# Patient Record
Sex: Male | Born: 1990
Health system: Southern US, Community
[De-identification: ages and names within clinical notes are randomized; demographics above are authoritative.]

---

## 2015-05-05 ENCOUNTER — Emergency Department (HOSPITAL_BASED_OUTPATIENT_CLINIC_OR_DEPARTMENT_OTHER): Payer: BLUE CROSS/BLUE SHIELD

## 2015-05-05 ENCOUNTER — Emergency Department (HOSPITAL_BASED_OUTPATIENT_CLINIC_OR_DEPARTMENT_OTHER)
Admission: EM | Admit: 2015-05-05 | Discharge: 2015-05-05 | Disposition: A | Discharge status: Home / Self Care | Payer: BLUE CROSS/BLUE SHIELD | Attending: Emergency Medicine | Admitting: Emergency Medicine

## 2015-05-05 ENCOUNTER — Encounter (HOSPITAL_BASED_OUTPATIENT_CLINIC_OR_DEPARTMENT_OTHER): Payer: Self-pay | Admitting: Emergency Medicine

## 2015-05-05 DIAGNOSIS — R1032 Left lower quadrant pain: Secondary | ICD-10-CM | POA: Diagnosis present

## 2015-05-05 DIAGNOSIS — K529 Noninfective gastroenteritis and colitis, unspecified: Secondary | ICD-10-CM | POA: Insufficient documentation

## 2015-05-05 LAB — COMPREHENSIVE METABOLIC PANEL
ALT: 75 U/L — AB (ref 17–63)
ANION GAP: 7 (ref 5–15)
AST: 39 U/L (ref 15–41)
Albumin: 4.4 g/dL (ref 3.5–5.0)
Alkaline Phosphatase: 61 U/L (ref 38–126)
BUN: 15 mg/dL (ref 6–20)
CHLORIDE: 106 mmol/L (ref 101–111)
CO2: 25 mmol/L (ref 22–32)
CREATININE: 1.06 mg/dL (ref 0.61–1.24)
Calcium: 9.4 mg/dL (ref 8.9–10.3)
GFR calc non Af Amer: >60 mL/min (ref >60)
Glucose, Bld: 97 mg/dL (ref 65–99)
Potassium: 4 mmol/L (ref 3.5–5.1)
SODIUM: 138 mmol/L (ref 135–145)
Total Bilirubin: 1.1 mg/dL (ref 0.3–1.2)
Total Protein: 7.3 g/dL (ref 6.5–8.1)

## 2015-05-05 LAB — CBC WITH DIFFERENTIAL/PLATELET
Basophils Absolute: 0.1 10*3/uL (ref 0.0–0.1)
Basophils Relative: 1 %
Eosinophils Absolute: 0.1 10*3/uL (ref 0.0–0.7)
Eosinophils Relative: 1 %
HEMATOCRIT: 42.4 % (ref 39.0–52.0)
HEMOGLOBIN: 15.3 g/dL (ref 13.0–17.0)
LYMPHS ABS: 2.3 10*3/uL (ref 0.7–4.0)
Lymphocytes Relative: 25 %
MCH: 31.5 pg (ref 26.0–34.0)
MCHC: 36.1 g/dL — AB (ref 30.0–36.0)
MCV: 87.2 fL (ref 78.0–100.0)
MONOS PCT: 10 %
Monocytes Absolute: 0.9 10*3/uL (ref 0.1–1.0)
NEUTROS ABS: 5.8 10*3/uL (ref 1.7–7.7)
NEUTROS PCT: 63 %
Platelets: 182 10*3/uL (ref 150–400)
RBC: 4.86 MIL/uL (ref 4.22–5.81)
RDW: 12.4 % (ref 11.5–15.5)
WBC: 9.1 10*3/uL (ref 4.0–10.5)

## 2015-05-05 LAB — URINALYSIS, ROUTINE W REFLEX MICROSCOPIC
Bilirubin Urine: NEGATIVE
Glucose, UA: NEGATIVE mg/dL
HGB URINE DIPSTICK: NEGATIVE
Ketones, ur: NEGATIVE mg/dL
LEUKOCYTES UA: NEGATIVE
NITRITE: NEGATIVE
PROTEIN: NEGATIVE mg/dL
Specific Gravity, Urine: 1.021 (ref 1.005–1.030)
pH: 6 (ref 5.0–8.0)

## 2015-05-05 LAB — LIPASE, BLOOD: Lipase: 21 U/L (ref 11–51)

## 2015-05-05 MED ORDER — IOPAMIDOL (ISOVUE-300) INJECTION 61%
100.0000 mL | Freq: Once | INTRAVENOUS | Status: AC | PRN
  Administered 2015-05-05: 100 mL via INTRAVENOUS

## 2015-05-05 MED ORDER — CIPROFLOXACIN HCL 500 MG PO TABS: 500.0000 mg | ORAL_TABLET | Freq: Two times a day (BID) | ORAL | Status: DC

## 2015-05-05 MED ORDER — ONDANSETRON HCL 4 MG/2ML IJ SOLN: 4.0000 mg | Freq: Once | INTRAMUSCULAR | Status: DC

## 2015-05-05 MED ORDER — METRONIDAZOLE 500 MG PO TABS: 500.0000 mg | ORAL_TABLET | Freq: Two times a day (BID) | ORAL | Status: DC

## 2015-05-05 MED ORDER — KETOROLAC TROMETHAMINE 30 MG/ML IJ SOLN
30.0000 mg | Freq: Once | INTRAMUSCULAR | Status: AC
  Administered 2015-05-05: 30 mg via INTRAVENOUS
  Filled 2015-05-05: qty 1

## 2015-05-05 MED ORDER — FENTANYL CITRATE (PF) 100 MCG/2ML IJ SOLN: 100.0000 ug | Freq: Once | INTRAMUSCULAR | Status: DC

## 2015-05-05 MED ORDER — MORPHINE SULFATE 30 MG PO TABS
15.0000 mg | ORAL_TABLET | ORAL | Status: DC | PRN
Start: 2015-05-05 — End: 2020-09-01

## 2015-05-05 MED FILL — MORPHINE SULFATE IR 30 MG T: 30 | 3 days supply | Qty: 20 | Fill #0

## 2015-05-05 MED FILL — metroNIDAZOLE 500 MG TABS: 500 | 7 days supply | Qty: 14 | Fill #0

## 2015-05-05 MED FILL — CIPROFLOXACIN HCL 500 MG TA: 500 | 7 days supply | Qty: 14 | Fill #0

## 2015-05-05 NOTE — ED Provider Notes (Signed)
CSN: [id number]     Arrival date & time 05/05/15  1106 History   First MD Initiated Contact with Patient 05/05/15 1148     Chief Complaint  Patient presents with  . Abdominal Pain     (Consider location/radiation/quality/duration/timing/severity/associated sxs/prior Treatment) HPI   Darrell Sanchez Is a 25 year old male who presents emergency Department with chief complaint of left lower quadrant abdominal pain. The patient states that he has had progressively worsening left lower quadrant abdominal pain over the past 3 days. Patient states that has become intolerable and he came for evaluation this morning. He describes the pain as constant, achy, worse with movement, walking, or hip flexion. He states that he has had associated urgency of urination. He denies hematuria, flank pain. He denies diarrhea or constipation. He has a family history of a mother and grandmother who has a history of kidney stones. He denies a personal history of kidney stones. He denies nausea or vomiting. He has no other contributory past medical history  History reviewed. No pertinent past medical history. History reviewed. No pertinent past surgical history. History reviewed. No pertinent family history. Social History  Substance Use Topics  . Smoking status: Never Smoker   . Smokeless tobacco: None  . Alcohol Use: Yes     Comment: occ    Review of Systems  Ten systems reviewed and are negative for acute change, except as noted in the HPI.    Allergies  Review of patient's allergies indicates no known allergies.  Home Medications   Prior to Admission medications   Not on File   BP 129/70 mmHg  Pulse 78  Temp(Src) 97.3 F (36.3 C) (Oral)  Resp 18  Ht  (1.753 m)  Wt 85.276 kg  BMI 27.75 kg/m2  SpO2 98% Physical Exam  Constitutional: He appears well-developed and well-nourished. No distress.  HENT:  Head: Normocephalic and atraumatic.  Eyes: Conjunctivae are normal. No scleral icterus.    Neck: Normal range of motion. Neck supple.  Cardiovascular: Normal rate, regular rhythm and normal heart sounds.   Pulmonary/Chest: Effort normal and breath sounds normal. No respiratory distress.  Abdominal: Soft. Bowel sounds are normal. There is tenderness in the left lower quadrant. There is guarding. There is no rebound and no CVA tenderness. No hernia.    Musculoskeletal: He exhibits no edema.  Neurological: He is alert.  Skin: Skin is warm and dry. He is not diaphoretic.  Psychiatric: His behavior is normal.  Nursing note and vitals reviewed.   ED Course  Procedures (including critical care time) Labs Review Labs Reviewed  CBC WITH DIFFERENTIAL/PLATELET - Abnormal; Notable for the following:    MCHC 36.1 (*)    All other components within normal limits  URINALYSIS, ROUTINE W REFLEX MICROSCOPIC (NOT AT [facility])  COMPREHENSIVE METABOLIC PANEL  LIPASE, BLOOD    Imaging Review No results found. I have personally reviewed and evaluated these images and lab results as part of my medical decision-making.   EKG Interpretation None      MDM   Final diagnoses:  None    12:33 PM BP 129/70 mmHg  Pulse 78  Temp(Src) 97.3 F (36.3 C) (Oral)  Resp 18  Ht  (1.753 m)  Wt 85.276 kg  BMI 27.75 kg/m2  SpO2 98%  Patient with llq abdominal pain and tenderness. Neg CVA and clean urine.  ? Diverticulitis/ colitis. ddx psoas abscess/dysfunction, ureteral colic, uti, abdominal wall tenderness.  Patient CT scan positive for colitis. I have discussed the  finding with the patient. He will be discharged with Cipro and Flagyl along with pain medication, antinausea medications. Discussed need for clear liquid diet for next 3 days and then slow advancement. Patient is to follow up with gastroenterology for colonoscopy and he understands the plan of care. He appears safe for discharge at this time. Discussed return precautions.   Arthor Captainbigail Rupa Lagan, PA-C 05/05/15 1704  Jerelyn ScottMartha Linker,  MD 05/06/15 [phone]

## 2015-05-05 NOTE — Discharge Instructions (Signed)
Colitis Colitis is inflammation of the colon. Colitis may last a short time (acute) or it may last a long time (chronic). CAUSES This condition may be caused by:  Viruses.  Bacteria.  Reactions to medicine.  Certain autoimmune diseases, such as Crohn disease or ulcerative colitis. SYMPTOMS Symptoms of this condition include:  Diarrhea.  Passing bloody or tarry stool.  Pain.  Fever.  Vomiting.  Tiredness (fatigue).  Weight loss.  Bloating.  Sudden increase in abdominal pain.  Having fewer bowel movements than usual. DIAGNOSIS This condition is diagnosed with a stool test or a blood test. You may also have other tests, including X-rays, a CT scan, or a colonoscopy. TREATMENT Treatment may include:  Resting the bowel. This involves not eating or drinking for a period of time.  Fluids that are given through an IV tube.  Medicine for pain and diarrhea.  Antibiotic medicines.  Cortisone medicines.  Surgery. HOME CARE INSTRUCTIONS Eating and Drinking  Follow instructions from your health care provider about eating or drinking restrictions.  Drink enough fluid to keep your urine clear or pale yellow.  Work with a dietitian to determine which foods cause your condition to flare up.  Avoid foods that cause flare-ups.  Eat a well-balanced diet. Medicines  Take over-the-counter and prescription medicines only as told by your health care provider.  If you were prescribed an antibiotic medicine, take it as told by your health care provider. Do not stop taking the antibiotic even if you start to feel better. General Instructions  Keep all follow-up visits as told by your health care provider. This is important. SEEK MEDICAL CARE IF:  Your symptoms do not go away.  You develop new symptoms. SEEK IMMEDIATE MEDICAL CARE IF:  You have a fever that does not go away with treatment.  You develop chills.  You have extreme weakness, fainting, or  dehydration.  You have repeated vomiting.  You develop severe pain in your abdomen.  You pass bloody or tarry stool.   This information is not intended to replace advice given to you by your health care provider. Make sure you discuss any questions you have with your health care provider.   Document Released: 03/09/2004 Document Revised: 10/21/2014 Document Reviewed: 05/25/2014 Elsevier Interactive Patient Education 2016 Else[employer] Clear Liquid Diet A clear liquid diet is a short-term diet that is prescribed to provide the necessary fluid and basic energy you need when you can have nothing else. The clear liquid diet consists of liquids or solids that will become liquid at room temperature. You should be able to see through the liquid. There are many reasons that you may be restricted to clear liquids, such as:  When you have a sudden-onset (acute) condition that occurs before or after surgery.  To help your body slowly get adjusted to food again after a long period when you were unable to have food.  Replacement of fluids when you have a diarrheal disease.  When you are going to have certain exams, such as a colonoscopy, in which instruments are inserted inside your body to look at parts of your digestive system. WHAT CAN I HAVE? A clear liquid diet does not provide all the nutrients you need. It is important to choose a variety of the following items to get as many nutrients as possible:  Vegetable juices that do not have pulp.  Fruit juices and fruit drinks that do not have pulp.  Coffee (regular or decaffeinated), tea, or soda at the discretion  of your health care provider.  Clear bouillon, broth, or strained broth-based soups.  High-protein and flavored gelatins.  Sugar or honey.  Ices or frozen ice pops that do not contain milk. If you are not sure whether you can have certain items, you should ask your health care provider. You may also ask your health care provider  if there are any other clear liquid options.   This information is not intended to replace advice given to you by your health care provider. Make sure you discuss any questions you have with your health care provider.   Document Released: 01/30/2005 Document Revised: 02/04/2013 Document Reviewed: 12/27/2012 Elsevier Interactive Patient Education [employer] Elsevier Inc[employer]

## 2015-05-05 NOTE — ED Notes (Addendum)
Patient states that he is having pain to her lower left abdominal area x 2 days. LBM this am. Denies and N/V/D

## 2015-05-05 NOTE — ED Notes (Signed)
Patient transported to and from radiology department via stretcher. 

## 2020-09-01 ENCOUNTER — Emergency Department (HOSPITAL_COMMUNITY)
Admission: EM | Admit: 2020-09-01 | Discharge: 2020-09-01 | Disposition: A | Discharge status: Home / Self Care | Payer: 59 | Attending: Emergency Medicine | Admitting: Emergency Medicine

## 2020-09-01 ENCOUNTER — Emergency Department (HOSPITAL_COMMUNITY): Payer: 59

## 2020-09-01 ENCOUNTER — Encounter (HOSPITAL_COMMUNITY): Payer: Self-pay | Admitting: Emergency Medicine

## 2020-09-01 ENCOUNTER — Other Ambulatory Visit: Payer: Self-pay

## 2020-09-01 DIAGNOSIS — N201 Calculus of ureter: Secondary | ICD-10-CM | POA: Insufficient documentation

## 2020-09-01 DIAGNOSIS — R109 Unspecified abdominal pain: Secondary | ICD-10-CM | POA: Diagnosis present

## 2020-09-01 LAB — CBC WITH DIFFERENTIAL/PLATELET
Abs Immature Granulocytes: 0.04 10*3/uL (ref 0.00–0.07)
Basophils Absolute: 0.1 10*3/uL (ref 0.0–0.1)
Basophils Relative: 1 %
Eosinophils Absolute: 0 10*3/uL (ref 0.0–0.5)
Eosinophils Relative: 0 %
HCT: 47.2 % (ref 39.0–52.0)
Hemoglobin: 17.2 g/dL — ABNORMAL HIGH (ref 13.0–17.0)
Immature Granulocytes: 0 %
Lymphocytes Relative: 22 %
Lymphs Abs: 3.6 10*3/uL (ref 0.7–4.0)
MCH: 32.1 pg (ref 26.0–34.0)
MCHC: 36.4 g/dL — ABNORMAL HIGH (ref 30.0–36.0)
MCV: 88.1 fL (ref 80.0–100.0)
Monocytes Absolute: 1 10*3/uL (ref 0.1–1.0)
Monocytes Relative: 6 %
Neutro Abs: 11.7 10*3/uL — ABNORMAL HIGH (ref 1.7–7.7)
Neutrophils Relative %: 71 %
Platelets: 283 10*3/uL (ref 150–400)
RBC: 5.36 MIL/uL (ref 4.22–5.81)
RDW: 12.3 % (ref 11.5–15.5)
WBC: 16.5 10*3/uL — ABNORMAL HIGH (ref 4.0–10.5)
nRBC: 0 % (ref 0.0–0.2)

## 2020-09-01 LAB — COMPREHENSIVE METABOLIC PANEL
ALT: 40 U/L (ref 0–44)
AST: 31 U/L (ref 15–41)
Albumin: 5.4 g/dL — ABNORMAL HIGH (ref 3.5–5.0)
Alkaline Phosphatase: 64 U/L (ref 38–126)
Anion gap: 13 (ref 5–15)
BUN: 18 mg/dL (ref 6–20)
CO2: 21 mmol/L — ABNORMAL LOW (ref 22–32)
Calcium: 9.8 mg/dL (ref 8.9–10.3)
Chloride: 105 mmol/L (ref 98–111)
Creatinine, Ser: 1.07 mg/dL (ref 0.61–1.24)
GFR, Estimated: >60 mL/min (ref >60)
Glucose, Bld: 115 mg/dL — ABNORMAL HIGH (ref 70–99)
Potassium: 3.7 mmol/L (ref 3.5–5.1)
Sodium: 139 mmol/L (ref 135–145)
Total Bilirubin: 2.2 mg/dL — ABNORMAL HIGH (ref 0.3–1.2)
Total Protein: 8.5 g/dL — ABNORMAL HIGH (ref 6.5–8.1)

## 2020-09-01 LAB — URINALYSIS, ROUTINE W REFLEX MICROSCOPIC
Bacteria, UA: NONE SEEN
Bilirubin Urine: NEGATIVE
Glucose, UA: NEGATIVE mg/dL
Ketones, ur: 80 mg/dL — AB
Leukocytes,Ua: NEGATIVE
Nitrite: NEGATIVE
Protein, ur: NEGATIVE mg/dL
RBC / HPF: >50 RBC/hpf — ABNORMAL HIGH (ref 0–5)
Specific Gravity, Urine: 1.026 (ref 1.005–1.030)
pH: 5 (ref 5.0–8.0)

## 2020-09-01 MED ORDER — KETOROLAC TROMETHAMINE 30 MG/ML IJ SOLN
30.0000 mg | Freq: Once | INTRAMUSCULAR | Status: AC
  Administered 2020-09-01: 30 mg via INTRAVENOUS
  Filled 2020-09-01: qty 1

## 2020-09-01 MED ORDER — TAMSULOSIN HCL 0.4 MG PO CAPS: 0.4000 mg | ORAL_CAPSULE | Freq: Every day | ORAL | 0 refills | Status: DC

## 2020-09-01 MED ORDER — ONDANSETRON HCL 4 MG/2ML IJ SOLN
4.0000 mg | Freq: Once | INTRAMUSCULAR | Status: AC
  Administered 2020-09-01: 4 mg via INTRAVENOUS
  Filled 2020-09-01: qty 2

## 2020-09-01 MED ORDER — HYDROMORPHONE HCL 1 MG/ML IJ SOLN
1.0000 mg | Freq: Once | INTRAMUSCULAR | Status: AC
  Administered 2020-09-01: 1 mg via INTRAVENOUS
  Filled 2020-09-01: qty 1

## 2020-09-01 MED ORDER — ONDANSETRON HCL 4 MG PO TABS: 4.0000 mg | ORAL_TABLET | Freq: Three times a day (TID) | ORAL | 0 refills | Status: DC | PRN

## 2020-09-01 MED ORDER — OXYCODONE-ACETAMINOPHEN 5-325 MG PO TABS: 1.0000 | ORAL_TABLET | Freq: Four times a day (QID) | ORAL | 0 refills | Status: DC | PRN

## 2020-09-01 MED ORDER — SODIUM CHLORIDE 0.9 % IV BOLUS
500.0000 mL | Freq: Once | INTRAVENOUS | Status: AC
  Administered 2020-09-01: 500 mL via INTRAVENOUS

## 2020-09-01 MED ORDER — OXYCODONE-ACETAMINOPHEN 5-325 MG PO TABS
1.0000 | ORAL_TABLET | Freq: Once | ORAL | Status: AC
  Administered 2020-09-01: 1 via ORAL
  Filled 2020-09-01: qty 1

## 2020-09-01 NOTE — ED Triage Notes (Signed)
Patient c/o intermittent right flank pain today. Also reports urinary urgency.

## 2020-09-01 NOTE — Discharge Instructions (Addendum)
You were seen in the emergency department for evaluation of right flank and right lower quadrant abdominal pain.  You had some blood in your urine.  Your CT scan showed a 1 mm stone in the tube going from the kidney to the bladder.  This is of a size that should pass on its own.  We are prescribing you some pain medication nausea medication and medication to help dilate the tube.  If you experience any fever or pain is not controlled by the medication you should return to the emergency department.  Otherwise follow-up with [facility].

## 2020-09-01 NOTE — ED Provider Notes (Signed)
Emergency Medicine Provider Triage Evaluation Note  Darrell Sanchez , a 30 y.o. male  was evaluated in triage.  Pt complains of right-sided abdominal pain that started around 11 AM.  Patient denies a history of similar symptoms.  States that his pain has been progressive for the past 5 to 6 hours.  Began worsening about 1 hour ago and radiating to the right flank.  Reports difficulty with urination but denies any pain with urination.  No nausea or vomiting.  Physical Exam  BP 129/85   Pulse 78   Temp 98.5 F (36.9 C)   Resp 18   SpO2 96%  Gen:   Awake, no distress   Resp:  Normal effort  MSK:   Moves extremities without difficulty  Other:    Medical Decision Making  Medically screening exam initiated at 4:06 PM.  Appropriate orders placed.  Darrell Sanchez was informed that the remainder of the evaluation will be completed by another provider, this initial triage assessment does not replace that evaluation, and the importance of remaining in the ED until their evaluation is complete.   Placido Sou, PA-C 09/01/20 1608    Terrilee Files, MD 09/02/20 [phone]

## 2020-09-01 NOTE — ED Provider Notes (Signed)
[facility] Provider Note   CSN: [id number] Arrival date & time: 09/01/20  1537     History Chief Complaint  Patient presents with   Flank Pain    Darrell Sanchez is a 30 y.o. male.  He is here with a complaint of severe right flank and right-sided abdominal pain that started around 5 hours ago.  No prior history of same.  No trauma.  Initially did not have any nausea and vomiting but just vomited 3 times and there are some blood in the vomitus.  No recent fever.  No testicular pain.  The history is provided by the patient.  Flank Pain This is a new problem. The current episode started 3 to 5 hours ago. The problem occurs constantly. The problem has been gradually worsening. Associated symptoms include abdominal pain. Pertinent negatives include no chest pain, no headaches and no shortness of breath. Nothing aggravates the symptoms. Nothing relieves the symptoms. He has tried rest for the symptoms. The treatment provided no relief.      History reviewed. No pertinent past medical history.  There are no problems to display for this patient.   History reviewed. No pertinent surgical history.     No family history on file.  Social History   Tobacco Use   Smoking status: Never  Substance Use Topics   Alcohol use: Yes    Comment: occ   Drug use: No    Home Medications Prior to Admission medications   Medication Sig Start Date End Date Taking? Authorizing Provider  ciprofloxacin (CIPRO) 500 MG tablet Take 1 tablet (500 mg total) by mouth 2 (two) times daily. 05/05/15   Arthor Captain, PA-C  metroNIDAZOLE (FLAGYL) 500 MG tablet Take 1 tablet (500 mg total) by mouth 2 (two) times daily. One po bid x 7 days 05/05/15   Arthor Captain, PA-C  morphine (MSIR) 30 MG tablet Take 0.5-1 tablets (15-30 mg total) by mouth every 4 (four) hours as needed for severe pain. 05/05/15   Arthor Captain, PA-C    Allergies    Patient has no known  allergies.  Review of Systems   Review of Systems  Constitutional:  Negative for fever.  HENT:  Negative for sore throat.   Eyes:  Negative for visual disturbance.  Respiratory:  Negative for shortness of breath.   Cardiovascular:  Negative for chest pain.  Gastrointestinal:  Positive for abdominal pain.  Genitourinary:  Positive for difficulty urinating and flank pain. Negative for dysuria and testicular pain.  Musculoskeletal:  Positive for back pain.  Skin:  Negative for rash.  Neurological:  Negative for headaches.   Physical Exam Updated Vital Signs BP 129/85   Pulse 78   Temp 98.5 F (36.9 C)   Resp 18   SpO2 96%   Physical Exam Vitals and nursing note reviewed.  Constitutional:      General: He is in acute distress (pain).     Appearance: Normal appearance. He is well-developed.  HENT:     Head: Normocephalic and atraumatic.  Eyes:     Conjunctiva/sclera: Conjunctivae normal.  Cardiovascular:     Rate and Rhythm: Normal rate and regular rhythm.     Heart sounds: No murmur heard. Pulmonary:     Effort: Pulmonary effort is normal. No respiratory distress.     Breath sounds: Normal breath sounds.  Abdominal:     Palpations: Abdomen is soft.     Tenderness: There is no guarding or rebound.  Musculoskeletal:  General: No deformity or signs of injury. Normal range of motion.     Cervical back: Neck supple.  Skin:    General: Skin is warm and dry.  Neurological:     General: No focal deficit present.     Mental Status: He is alert.    ED Results / Procedures / Treatments   Labs (all labs ordered are listed, but only abnormal results are displayed) Labs Reviewed  COMPREHENSIVE METABOLIC PANEL - Abnormal; Notable for the following components:      Result Value   CO2 21 (*)    Glucose, Bld 115 (*)    Total Protein 8.5 (*)    Albumin 5.4 (*)    Total Bilirubin 2.2 (*)    All other components within normal limits  CBC WITH DIFFERENTIAL/PLATELET -  Abnormal; Notable for the following components:   WBC 16.5 (*)    Hemoglobin 17.2 (*)    MCHC 36.4 (*)    Neutro Abs 11.7 (*)    All other components within normal limits  URINALYSIS, ROUTINE W REFLEX MICROSCOPIC - Abnormal; Notable for the following components:   APPearance HAZY (*)    Hgb urine dipstick MODERATE (*)    Ketones, ur 80 (*)    RBC / HPF >50 (*)    All other components within normal limits    EKG None  Radiology CT Renal Stone Study  Result Date: 09/01/2020 CLINICAL DATA:  Flank pain.  Kidney stone suspected EXAM: CT ABDOMEN AND PELVIS WITHOUT CONTRAST TECHNIQUE: Multidetector CT imaging of the abdomen and pelvis was performed following the standard protocol without IV contrast. COMPARISON:  None. FINDINGS: Lower chest: Lung bases are clear. Hepatobiliary: No focal hepatic lesion. No biliary duct dilatation. Common bile duct is normal. Pancreas: Pancreas is normal. No ductal dilatation. No pancreatic inflammation. Spleen: Normal spleen Adrenals/urinary tract: Adrenal glands normal. Several punctate 1 mm calculi in the RIGHT kidney. Mild hydroureter on the RIGHT. There is a partially obstructing calculus in the distal RIGHT ureter measuring 2 mm on image 76/2. This small distal RIGHT ureteral calculus is approximately 1 cm from the RIGHT vesicoureteral junction. LEFT ureter normal.  No bladder calculi. Stomach/Bowel: Stomach, small bowel, appendix, and cecum are normal.Appendicolith (4 mm) towards the orifice of the appendix. No appendiceal inflammation (image 59/2). The colon and rectosigmoid colon are normal. Vascular/Lymphatic: Abdominal aorta is normal caliber. No periportal or retroperitoneal adenopathy. No pelvic adenopathy. Reproductive: Prostate unremarkable Other: No free fluid. Musculoskeletal: No aggressive osseous lesion. IMPRESSION: 1. Partially obstructing calculus in the distal RIGHT ureter approximately 1 cm from the RIGHT vascular junction. 2. Several additional  nonobstructing punctate RIGHT renal calculi. 3. Incidental finding of appendicolith within a noninflamed normal appendix. Electronically Signed   By: Genevive Bi M.D.   On: 09/01/2020 18:18    Procedures Procedures   Medications Ordered in ED Medications  oxyCODONE-acetaminophen (PERCOCET/ROXICET) 5-325 MG per tablet 1 tablet (1 tablet Oral Given 09/01/20 1624)  HYDROmorphone (DILAUDID) injection 1 mg (1 mg Intravenous Given 09/01/20 1653)  ondansetron (ZOFRAN) injection 4 mg (4 mg Intravenous Given 09/01/20 1653)  ketorolac (TORADOL) 30 MG/ML injection 30 mg (30 mg Intravenous Given 09/01/20 1653)  sodium chloride 0.9 % bolus 500 mL (0 mLs Intravenous Stopped 09/01/20 1929)  HYDROmorphone (DILAUDID) injection 1 mg (1 mg Intravenous Given 09/01/20 1710)    ED Course  I have reviewed the triage vital signs and the nursing notes.  Pertinent labs & imaging results that were available during my care of  the patient were reviewed by me and considered in my medical decision making (see chart for details).  Clinical Course as of 09/02/20 0956  Wed Sep 01, 2020  1826 he looks much more comfortable.  Labs shows elevated white count which likely reflects more stress reaction than anything.  Urinalysis with greater than 50 red blood cells, 6-10 whites.  Low bicarb reflecting some volume contracture.  He looks much more comfortable after second dose of pain medicine.  CT imaging showing 1 mm stone distal right ureteral.  I reviewed these results with him and recommended trial of observation at home with urology follow-up.  Return instructions discussed [MB]    Clinical Course User Index [MB] Terrilee Files, MD   MDM Rules/Calculators/A&P                          This patient complains of right flank right lower quadrant abdominal pain; this involves an extensive number of treatment Options and is a complaint that carries with it a high risk of complications and Morbidity. The differential  includes renal colic, pyelonephritis, appendicitis, obstruction  I ordered, reviewed and interpreted labs, which included CBC with elevated white count likely reactive, normal hemoglobin, chemistries with mildly low bicarb possibly reflecting some volume contracture, elevated bilirubin unclear significance, urinalysis greater than 50 red cells, 80 ketones, 6-10 whites.  Doubt this indicates infection. I ordered medication IV fluids, Toradol Zofran and Dilaudid with improvement in patient's pain I ordered imaging studies which included CT renal and I independently    visualized and interpreted imaging which showed 1 mm distal stone on right likely culprit Previous records obtained and reviewed in epic no recent visits   After the interventions stated above, I reevaluated the patient and found patient to be much more comfortable.  Reviewed expectant management of kidney stones with him.  I provided prescriptions for pain nausea medication and Flomax.  Urology outpatient contact information given.  Return instructions discussed   Final Clinical Impression(s) / ED Diagnoses Final diagnoses:  Ureterolithiasis    Rx / DC Orders ED Discharge Orders          Ordered    oxyCODONE-acetaminophen (PERCOCET/ROXICET) 5-325 MG tablet  Every 6 hours PRN        09/01/20 1900    ondansetron (ZOFRAN) 4 MG tablet  Every 8 hours PRN        09/01/20 1900    tamsulosin (FLOMAX) 0.4 MG CAPS capsule  Daily        09/01/20 1900             Terrilee Files, MD 09/02/20 [phone]

## 2021-10-02 IMAGING — CT CT RENAL STONE PROTOCOL
2 of 4 series · 16 of 46 positions shown, 18 images · non-contrast
Comparison: None.

CLINICAL DATA: Flank pain.  Kidney stone suspected

EXAM:
CT ABDOMEN AND PELVIS WITHOUT CONTRAST
TECHNIQUE: Multidetector CT imaging of the abdomen and pelvis was performed
following the standard protocol without IV contrast.

[Series 2: axial st · axial · 0.77mm/px · z∈[-552,-112]mm · 13 of 100 slices shown, 15 images]
[im 6/100  soft-tissue]
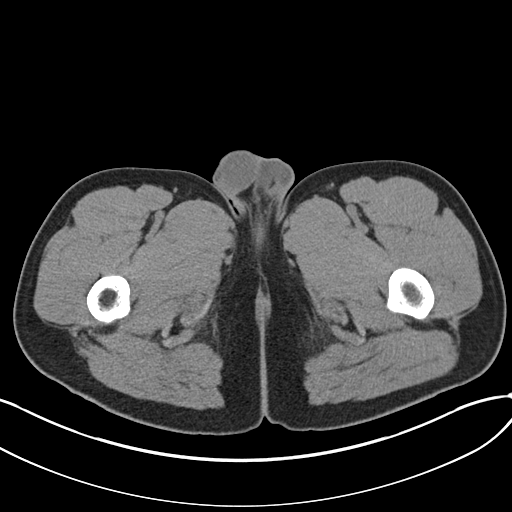
[im 6/100  bone]
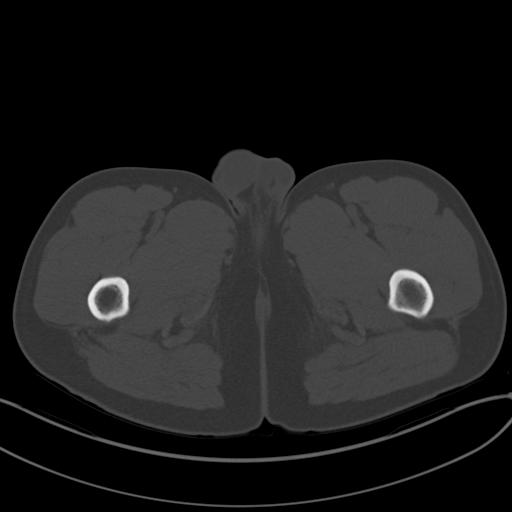
[im 12/100  soft-tissue]
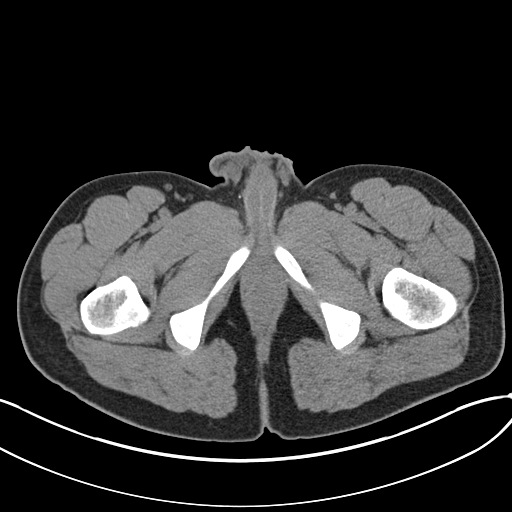
[im 23/100  soft-tissue]
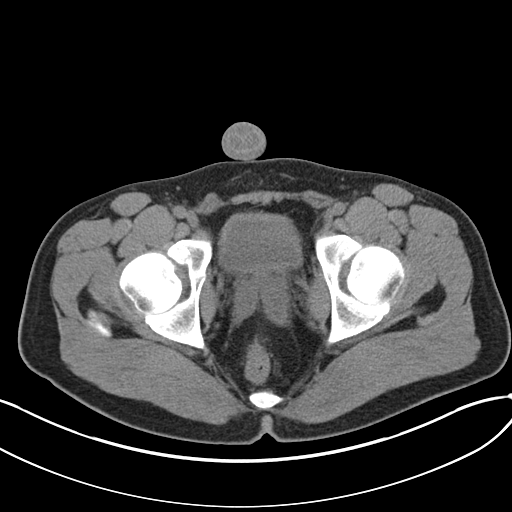
[im 28/100  soft-tissue]
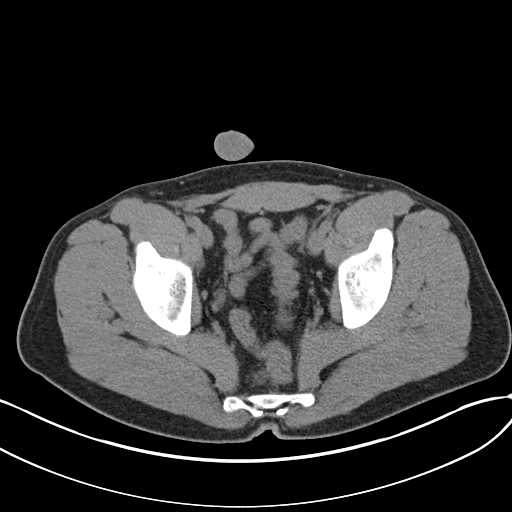
[im 34/100  soft-tissue]
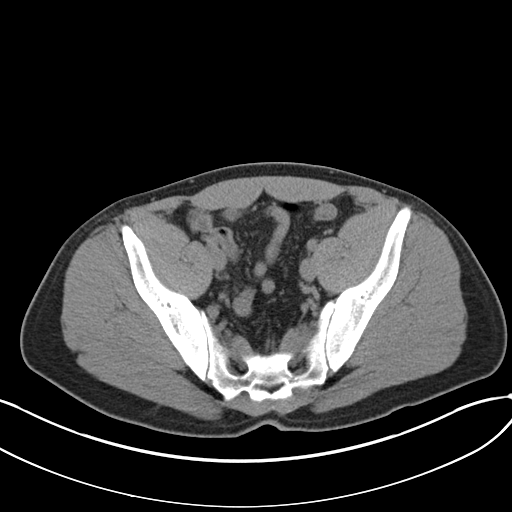
[im 45/100  soft-tissue]
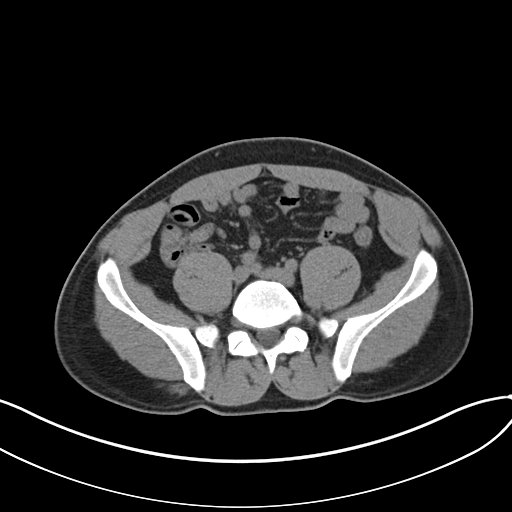
[im 50/100  soft-tissue]
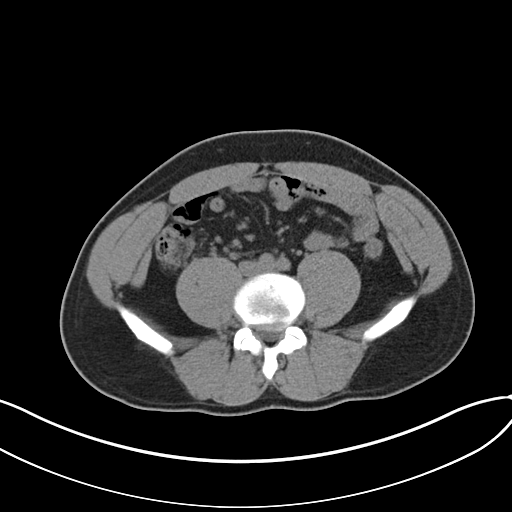
[im 56/100  soft-tissue]
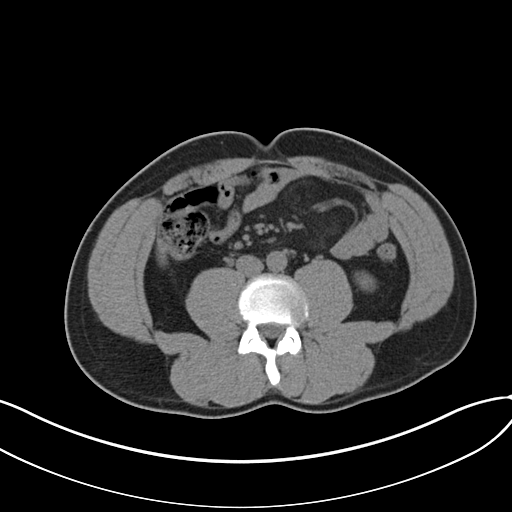
[im 67/100  soft-tissue]
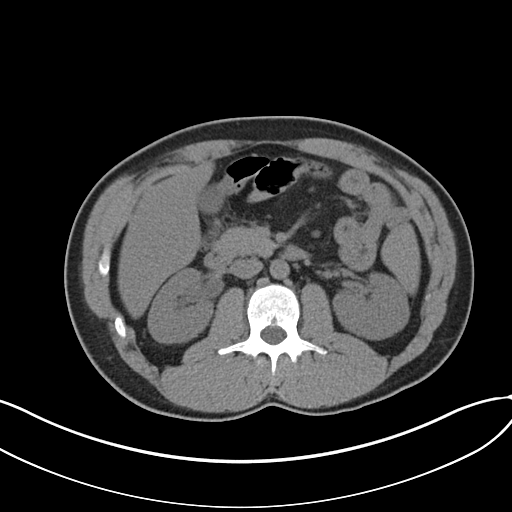
[im 67/100  bone]
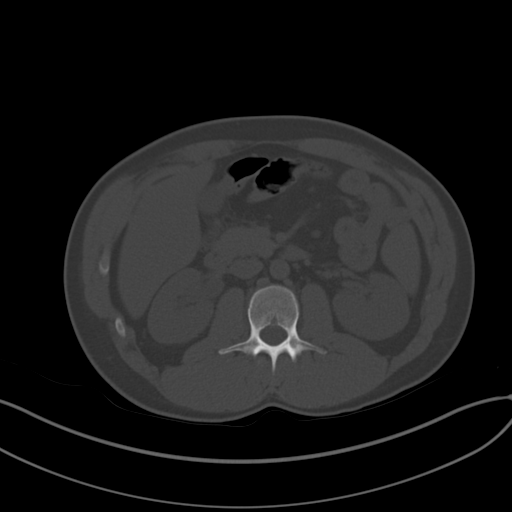
[im 72/100  soft-tissue]
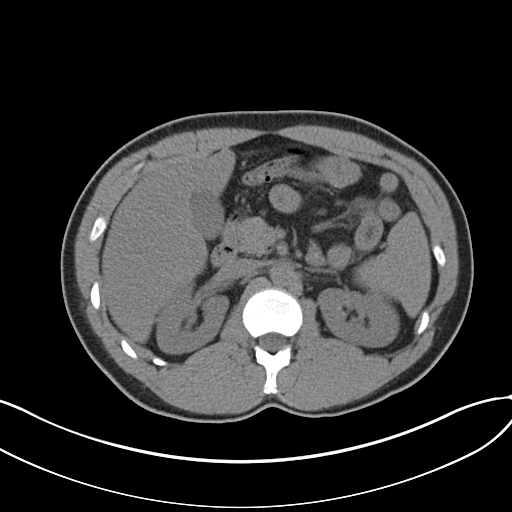
[im 78/100  soft-tissue]
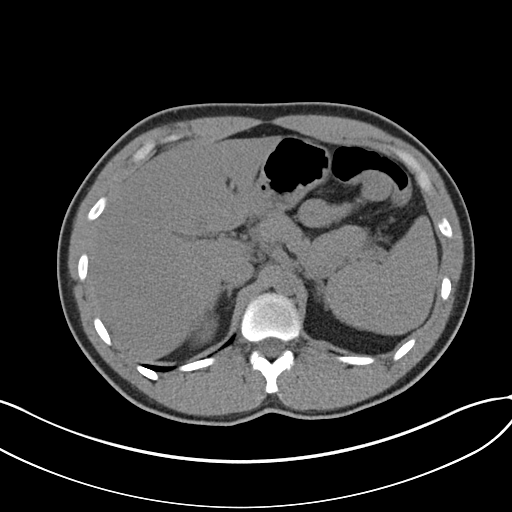
[im 89/100  soft-tissue]
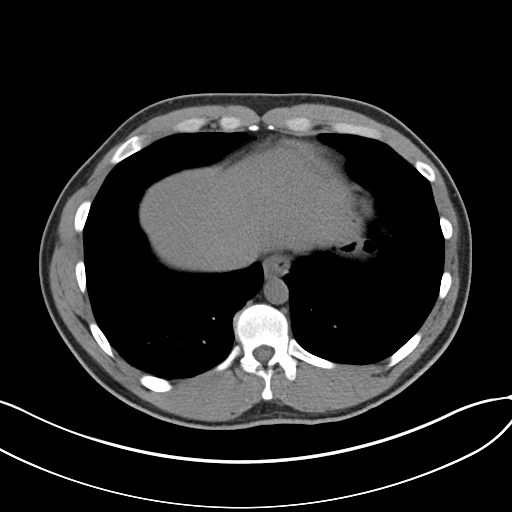
[im 94/100  soft-tissue]
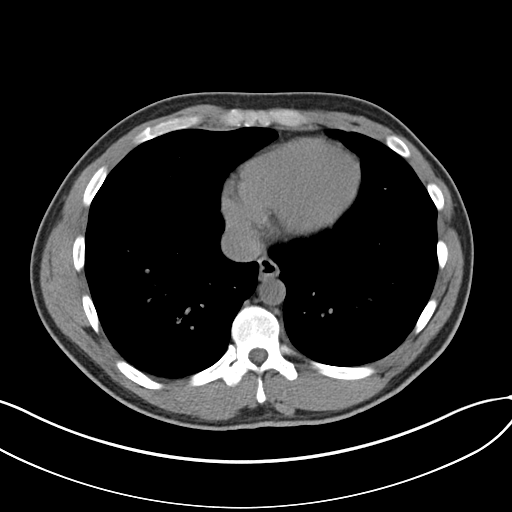

[Series 5: coronal · coronal · 0.73mm/px · 3 of 143 slices shown]
[im 48/143  soft-tissue]
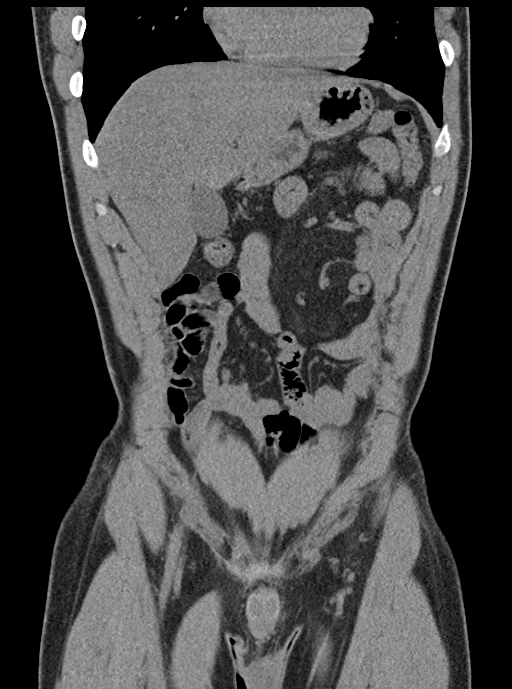
[im 64/143  soft-tissue]
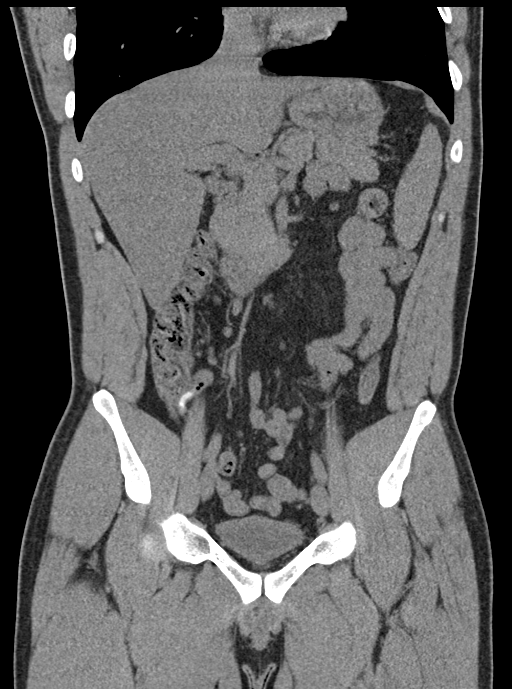
[im 79/143  soft-tissue]
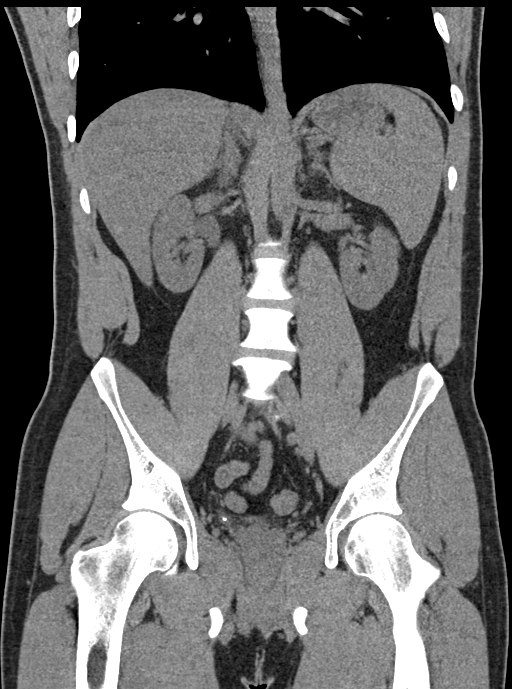

[16 of 46 positions shown; findings below may reference images not displayed]

FINDINGS: Lower chest: Lung bases are clear.

Hepatobiliary: No focal hepatic lesion. No biliary duct dilatation.
Common bile duct is normal.

Pancreas: Pancreas is normal. No ductal dilatation. No pancreatic
inflammation.

Spleen: Normal spleen

Adrenals/urinary tract: Adrenal glands normal. Several punctate 1 mm
calculi in the RIGHT kidney. Mild hydroureter on the RIGHT. There is
a partially obstructing calculus in the distal RIGHT ureter
measuring 2 mm on image 76/2. This small distal RIGHT ureteral
calculus is approximately 1 cm from the RIGHT vesicoureteral
junction.

LEFT ureter normal.  No bladder calculi.

Stomach/Bowel: Stomach, small bowel, appendix, and cecum are
normal.Appendicolith (4 mm) towards the orifice of the appendix. No
appendiceal inflammation (image 59/2). The colon and rectosigmoid
colon are normal.

Vascular/Lymphatic: Abdominal aorta is normal caliber. No periportal
or retroperitoneal adenopathy. No pelvic adenopathy.

Reproductive: Prostate unremarkable

Other: No free fluid.

Musculoskeletal: No aggressive osseous lesion.
IMPRESSION: 1. Partially obstructing calculus in the distal RIGHT ureter
approximately 1 cm from the RIGHT vascular junction.
2. Several additional nonobstructing punctate RIGHT renal calculi.
3. Incidental finding of appendicolith within a noninflamed normal
appendix.

## 2023-03-17 ENCOUNTER — Ambulatory Visit
Admission: RE | Admit: 2023-03-17 | Discharge: 2023-03-17 | Disposition: A | Discharge status: Home / Self Care | Payer: Self-pay | Source: Ambulatory Visit

## 2023-03-17 VITALS — BP 112/69 | HR 92 | Temp 98.2°F | Resp 18 | Ht 70.0 in | Wt 185.0 lb

## 2023-03-17 DIAGNOSIS — H5789 Other specified disorders of eye and adnexa: Secondary | ICD-10-CM | POA: Diagnosis not present

## 2023-03-17 DIAGNOSIS — H0289 Other specified disorders of eyelid: Secondary | ICD-10-CM

## 2023-03-17 MED ORDER — HYDROCORTISONE 0.5 % EX OINT: 1.0000 | TOPICAL_OINTMENT | Freq: Two times a day (BID) | CUTANEOUS | 0 refills | Status: DC

## 2023-03-17 MED ORDER — ERYTHROMYCIN 5 MG/GM OP OINT: TOPICAL_OINTMENT | OPHTHALMIC | 0 refills | Status: DC

## 2023-03-17 NOTE — ED Triage Notes (Signed)
put in allergy eye drops first of week and causing the eye skin to dry up.  hurts to touch.  almost like "pink eye". - Entered by patient

## 2023-03-17 NOTE — Discharge Instructions (Signed)
There was no evidence of a scratch of your eye.  I do recommend that you use lubricating eyedrops/artificial tears to help manage her symptoms.  You can apply erythromycin ointment twice daily into the lower lid of your right eye for 1 week.  Keep the skin around dry clean and apply hydrocortisone ointment up to twice a day.  Follow-up with ophthalmology; call to schedule an appointment first thing Monday.  If anything worsens and you have fever, pain when you move your eyes, vision change, nausea, vomiting you need to be seen immediately.

## 2023-03-17 NOTE — ED Provider Notes (Signed)
[facility]    CSN: [id number] Arrival date & time: 03/17/23  1248      History   Chief Complaint Chief Complaint  Patient presents with   Allergic Reaction    HPI Darrell Sanchez is a 33 y.o. male.   Patient presents today with a weeklong history of right eye irritation with associated erythema and pruritus of upper and lower eyelid.  He reports that symptoms began after he began using an antihistamine eyedrop.  He was using this in both eyes but only developed symptoms in his right eye.  He denies any visual disturbance increased from baseline, photophobia, fever, nausea, vomiting, cough, congestion.  He did do a telemedicine visit and was prescribed ciprofloxacin which she has been using without improvement of symptoms.  He does not wear contacts but does wear glasses.  He does not follow with ophthalmology regularly; [facility] as needed for updating glasses prescription.  He denies any foreign body sensation but just feels that the skin is irritated and itchy.  He has been applying Vaseline with minimal improvement of symptoms.    History reviewed. No pertinent past medical history.  There are no active problems to display for this patient.   History reviewed. No pertinent surgical history.     Home Medications    Prior to Admission medications   Medication Sig Start Date End Date Taking? Authorizing Provider  erythromycin ophthalmic ointment Place a 1/2 inch ribbon of ointment into the lower eyelid of right eye twice a day for 7 days 03/17/23  Yes Malak Orantes K, PA-C  hydrocortisone ointment 0.5 % Apply 1 Application topically 2 (two) times daily. 03/17/23  Yes Shondell Fabel K, PA-C  triamcinolone cream (KENALOG) 0.5 % Apply 1 Application topically 2 (two) times daily. 02/09/23  Yes [provider]  ondansetron (ZOFRAN) 4 MG tablet Take 1 tablet (4 mg total) by mouth every 8 (eight) hours as needed for nausea or vomiting. 09/01/20   Terrilee Files, MD  oseltamivir (TAMIFLU) 75 MG capsule Take 75 mg by mouth 2 (two) times daily.    [provider]    Family History History reviewed. No pertinent family history.  Social History Social History   Tobacco Use   Smoking status: Never   Smokeless tobacco: Never  Vaping Use   Vaping status: Never Used  Substance Use Topics   Alcohol use: Yes    Comment: Occassionally.   Drug use: Yes    Types: Marijuana    Comment: Dailyt.     Allergies   Patient has no known allergies.   Review of Systems Review of Systems  Constitutional:  Negative for activity change, appetite change, fatigue and fever.  HENT:  Negative for congestion.   Eyes:  Positive for redness and itching. Negative for photophobia, pain, discharge and visual disturbance.  Respiratory:  Negative for cough.   Gastrointestinal:  Negative for nausea and vomiting.  Neurological:  Negative for dizziness, light-headedness and headaches.     Physical Exam Triage Vital Signs ED Triage Vitals  Encounter Vitals Group     BP 03/17/23 1321 112/69     Systolic BP Percentile --      Diastolic BP Percentile --      Pulse Rate 03/17/23 1321 96     Resp 03/17/23 1321 18     Temp 03/17/23 1321 98.2 F (36.8 C)     Temp Source 03/17/23 1321 Oral     SpO2 03/17/23 1321 97 %  Weight 03/17/23 1316 185 lb (83.9 kg)     Height 03/17/23 1316 5\' 10"  (1.778 m)     Head Circumference --      Peak Flow --      Pain Score --      Pain Loc --      Pain Education --      Exclude from Growth Chart --    No data found.  Updated Vital Signs BP 112/69 (BP Location: Right Arm)   Pulse 92   Temp 98.2 F (36.8 C) (Oral)   Resp 18   Ht 5\' 10"  (1.778 m)   Wt 185 lb (83.9 kg)   SpO2 97%   BMI 26.54 kg/m   Visual Acuity Right Eye Distance: 20/30 (Uncorrected) Left Eye Distance: 20/40 (Uncorrected) Bilateral Distance: 20/30 (Uncorrected, Vision Screen [employer], Pass))  Right Eye Near:   Left Eye  Near:    Bilateral Near:     Physical Exam Vitals reviewed.  Constitutional:      General: He is awake.     Appearance: Normal appearance. He is well-developed. He is not ill-appearing.     Comments: Very pleasant male appears stated age in no acute distress sitting comfortably in exam room  HENT:     Head: Normocephalic and atraumatic.     Right Ear: External ear normal.     Left Ear: External ear normal.     Mouth/Throat:     Lips: Pink.  Eyes:     General: Lids are everted, no foreign bodies appreciated.     Extraocular Movements: Extraocular movements intact.     Conjunctiva/sclera: Conjunctivae normal.     Right eye: Right conjunctiva is not injected. No chemosis.    Left eye: Left conjunctiva is not injected. No chemosis.    Pupils: Pupils are equal, round, and reactive to light.     Right eye: No corneal abrasion or fluorescein uptake. Seidel exam negative.     Comments: Upper eyelid was everted with injection of medial palpebral conjunctiva without foreign body.  Erythema noted upper and lower eyelids worse at medial canthus.  No drainage noted.  No abnormalities on fluorescein stain.  Cardiovascular:     Rate and Rhythm: Normal rate and regular rhythm.     Heart sounds: Normal heart sounds, S1 normal and S2 normal. No murmur heard. Pulmonary:     Effort: Pulmonary effort is normal. No accessory muscle usage or respiratory distress.     Breath sounds: Normal breath sounds. No stridor. No wheezing, rhonchi or rales.     Comments: Clear to auscultation bilaterally Abdominal:     General: Bowel sounds are normal.     Palpations: Abdomen is soft.     Tenderness: There is no abdominal tenderness.  Neurological:     Mental Status: He is alert.  Psychiatric:        Behavior: Behavior is cooperative.      UC Treatments / Results  Labs (all labs ordered are listed, but only abnormal results are displayed) Labs Reviewed - No data to display  EKG   Radiology No  results found.  Procedures Procedures (including critical care time)  Medications Ordered in UC Medications - No data to display  Initial Impression / Assessment and Plan / UC Course  I have reviewed the triage vital signs and the nursing notes.  Pertinent labs & imaging results that were available during my care of the patient were reviewed by me and considered in my medical  decision making (see chart for details).     Patient is well-appearing, afebrile, nontoxic, nontachycardic.  No foreign body noted.  No obvious corneal abrasion or ulcer based on fluorescein staining.  He was encouraged to keep the area lubricated with over-the-counter lubricating eyedrops/artificial tears for symptom management.  He can also apply erythromycin ointment to cover for any kind of infection as well as provide lubrication/comfort.  He is to wash his hands before handling the medication and avoid touching tip of medication bottle to the eye.  He was prescribed hydrocortisone 0.5% with instruction to apply this to the upper and lower eyelid to help manage symptoms.  Given his ongoing symptoms I did recommend he follow-up with ophthalmology and he was given the contact information for local provider with instruction to call to schedule an appointment.  If anything worsens or changes he is to go to the ER.  Strict return precautions given.  He declined work excuse note.  Final Clinical Impressions(s) / UC Diagnoses   Final diagnoses:  Irritation of eyelid  Irritation of right eye     Discharge Instructions      There was no evidence of a scratch of your eye.  I do recommend that you use lubricating eyedrops/artificial tears to help manage her symptoms.  You can apply erythromycin ointment twice daily into the lower lid of your right eye for 1 week.  Keep the skin around dry clean and apply hydrocortisone ointment up to twice a day.  Follow-up with ophthalmology; call to schedule an appointment first thing  Monday.  If anything worsens and you have fever, pain when you move your eyes, vision change, nausea, vomiting you need to be seen immediately.     ED Prescriptions     Medication Sig Dispense Auth. Provider   erythromycin ophthalmic ointment Place a 1/2 inch ribbon of ointment into the lower eyelid of right eye twice a day for 7 days 3.5 g Hennessey Cantrell K, PA-C   hydrocortisone ointment 0.5 % Apply 1 Application topically 2 (two) times daily. 30 g Makeya Hilgert, Noberto Retort, PA-C      PDMP not reviewed this encounter.   Jeani Hawking, PA-C 03/17/23 1347

## 2023-04-05 ENCOUNTER — Ambulatory Visit (INDEPENDENT_AMBULATORY_CARE_PROVIDER_SITE_OTHER): Payer: 59 | Admitting: Family Medicine

## 2023-04-05 VITALS — BP 114/78 | HR 98 | Temp 98.3°F | Resp 16 | Ht 70.0 in | Wt 178.6 lb

## 2023-04-05 DIAGNOSIS — H01003 Unspecified blepharitis right eye, unspecified eyelid: Secondary | ICD-10-CM

## 2023-04-05 DIAGNOSIS — J302 Other seasonal allergic rhinitis: Secondary | ICD-10-CM | POA: Diagnosis not present

## 2023-04-05 DIAGNOSIS — Z7689 Persons encountering health services in other specified circumstances: Secondary | ICD-10-CM

## 2023-04-05 MED ORDER — CETIRIZINE HCL 10 MG PO TABS: 10.0000 mg | ORAL_TABLET | Freq: Every day | ORAL | 2 refills | Status: DC

## 2023-04-05 MED ORDER — ERYTHROMYCIN 5 MG/GM OP OINT: TOPICAL_OINTMENT | OPHTHALMIC | 0 refills | Status: DC

## 2023-04-05 NOTE — Progress Notes (Signed)
 Patient is here to established care with provider. ~health hx address ~care gaps address

## 2023-04-10 ENCOUNTER — Encounter: Payer: Self-pay | Admitting: Family Medicine

## 2023-04-10 NOTE — Progress Notes (Signed)
 New Patient Office Visit  Subjective    Patient ID: Darrell Sanchez, male    DOB: [birth year]  Age: 33 y.o. MRN: [id number]  CC:  Chief Complaint  Patient presents with   Establish Care    HPI Darrell Sanchez presents to establish care and for complaints of seasonal allergies and resolving/recurrent eyelid infection. Patient would like refills of meds that have helped these sx before.    Outpatient Encounter Medications as of 04/05/2023  Medication Sig   cetirizine (ZYRTEC) 10 MG tablet Take 1 tablet (10 mg total) by mouth daily.   erythromycin ophthalmic ointment Place a 1/2 inch ribbon of ointment into the lower eyelid of right eye twice a day for 7 days   hydrocortisone ointment 0.5 % Apply 1 Application topically 2 (two) times daily.   ondansetron (ZOFRAN) 4 MG tablet Take 1 tablet (4 mg total) by mouth every 8 (eight) hours as needed for nausea or vomiting.   oseltamivir (TAMIFLU) 75 MG capsule Take 75 mg by mouth 2 (two) times daily.   triamcinolone cream (KENALOG) 0.5 % Apply 1 Application topically 2 (two) times daily.   [DISCONTINUED] erythromycin ophthalmic ointment Place a 1/2 inch ribbon of ointment into the lower eyelid of right eye twice a day for 7 days   No facility-administered encounter medications on file as of 04/05/2023.    No past medical history on file.  No past surgical history on file.  No family history on file.  Social History   Socioeconomic History   Marital status: Divorced    Spouse name: Not on file   Number of children: Not on file   Years of education: Not on file   Highest education level: Not on file  Occupational History   Not on file  Tobacco Use   Smoking status: Never   Smokeless tobacco: Never  Vaping Use   Vaping status: Never Used  Substance and Sexual Activity   Alcohol use: Yes    Comment: Occassionally.   Drug use: Yes    Types: Marijuana    Comment: Dailyt.   Sexual activity: Not Currently  Other Topics Concern   Not on  file  Social History Narrative   Not on file   Social Drivers of Health   Financial Resource Strain: Low Risk  (04/05/2023)   Overall Financial Resource Strain (CARDIA)    Difficulty of Paying Living Expenses: Not very hard  Food Insecurity: No Food Insecurity (04/05/2023)   Hunger Vital Sign    Worried About Running Out of Food in the Last Year: Never true    Ran Out of Food in the Last Year: Never true  Transportation Needs: No Transportation Needs (04/05/2023)   PRAPARE - Administrator, Civil Service (Medical): No    Lack of Transportation (Non-Medical): No  Physical Activity: Inactive (04/05/2023)   Exercise Vital Sign    Days of Exercise per Week: 0 days    Minutes of Exercise per Session: 0 min  Stress: No Stress Concern Present (04/05/2023)   [employer] of Occupational Health - Occupational Stress Questionnaire    Feeling of Stress : Not at all  Social Connections: Moderately Integrated (04/05/2023)   Social Connection and Isolation Panel [NHANES]    Frequency of Communication with Friends and Family: Twice a week    Frequency of Social Gatherings with Friends and Family: Twice a week    Attends Religious Services: Never    Database administrator or Organizations: No  Attends Banker Meetings: 1 to 4 times per year    Marital Status: Living with partner  Intimate Partner Violence: Not At Risk (04/05/2023)   Humiliation, Afraid, Rape, and Kick questionnaire    Fear of Current or Ex-Partner: No    Emotionally Abused: No    Physically Abused: No    Sexually Abused: No    Review of Systems  All other systems reviewed and are negative.       Objective   BP 114/78   Pulse 98   Temp 98.3 F (36.8 C) (Oral)   Resp 16   Ht 5\' 10"  (1.778 m)   Wt 178 lb 9.6 oz (81 kg)   SpO2 95%   BMI 25.63 kg/m   Physical Exam Vitals and nursing note reviewed.  Constitutional:      General: He is not in acute distress. Eyes:     Comments: Slight  scaling and erythema of right eyelid  Cardiovascular:     Rate and Rhythm: Normal rate and regular rhythm.  Pulmonary:     Effort: Pulmonary effort is normal.     Breath sounds: Normal breath sounds.  Abdominal:     Palpations: Abdomen is soft.     Tenderness: There is no abdominal tenderness.  Neurological:     General: No focal deficit present.     Mental Status: He is alert and oriented to person, place, and time.         Assessment & Plan:   Blepharitis of eyelid of right eye, unspecified eyelid, unspecified type  Seasonal allergies  Encounter to establish care  Other orders -     Erythromycin; Place a 1/2 inch ribbon of ointment into the lower eyelid of right eye twice a day for 7 days  Dispense: 3.5 g; Refill: 0 -     Cetirizine HCl; Take 1 tablet (10 mg total) by mouth daily.  Dispense: 90 tablet; Refill: 2     Return if symptoms worsen or fail to improve.   Tommie Raymond, MD

## 2023-07-19 ENCOUNTER — Ambulatory Visit: Payer: Self-pay

## 2023-07-19 NOTE — Telephone Encounter (Signed)
 FYI Only or Action Required?: Action required by provider  Patient was last seen in primary care on 04/05/2023 by Abraham Abo, MD. Called Nurse Triage reporting Allergy medicine question. Symptoms began chronic. Interventions attempted: OTC medications: Flonase and Prescription medications: Zyrtec . Symptoms are: stable.  Triage Disposition: Home Care  Patient/caregiver understands and will follow disposition?: Yes  Copied from CRM [id number]. Topic: Clinical - Medication Question >> Jul 19, 2023  4:36 PM Magdalene [employer] wrote: Reason for CRM: Patient called to check if there is anything for allergies stronger than cetirizine  (ZYRTEC ) 10 MG tablet because it helped for a little bit but no longer helping. He stated that if there aren't any other options he would like a refill for this one. Reason for Disposition  [1] Nasal allergies AND [2] only certain times of year (hay fever)  Answer Assessment - Initial Assessment Questions 1. SYMPTOM: "What's the main symptom you're concerned about?" (e.g., runny nose, stuffiness, sneezing, itching)     Stuffy, runny nose, eyes water. 2. SEVERITY: "How bad is it?" "What does it keep you from doing?" (e.g., sleeping, working)      sleeping 3. EYES: "Are the eyes also red, watery, and itchy?"      itchy 4. TRIGGER: "What pollen or other allergic substance do you think is causing the symptoms?"      Pollen 5. TREATMENT: "What medicine are you using?" "What medicine worked best in the past?"     Zyrtec   6. OTHER SYMPTOMS: "Do you have any other symptoms?" (e.g., coughing, difficulty breathing, wheezing)     Denies all other symptoms  Additional information: Nasal and eye allergy symptoms are unchanged, feels zyrtec  is not as effective as it had been and requesting to try another medication. If no other medication is recommended he would like to have refill of zyrtec  and it does minimize symptoms. Note: He started otc Flonase today, educated that works best with  daily use and give few days for full effectiveness. Please advised on allergy medication or refill current script for Zyrtec .  Protocols used: Nasal Allergies (Hay Fever)-A-AH

## 2023-07-23 ENCOUNTER — Other Ambulatory Visit: Payer: Self-pay | Admitting: Family

## 2023-07-23 DIAGNOSIS — J3089 Other allergic rhinitis: Secondary | ICD-10-CM

## 2023-07-23 MED ORDER — CETIRIZINE HCL 10 MG PO TABS: 10.0000 mg | ORAL_TABLET | Freq: Every day | ORAL | 0 refills | Status: DC

## 2023-07-23 NOTE — Telephone Encounter (Signed)
 Cetirizine  prescribed. Schedule follow-up with Abraham Abo, MD.

## 2023-07-23 NOTE — Telephone Encounter (Signed)
 I called patient with recommendations no one answered so I left a voicemail to return my call

## 2023-07-25 ENCOUNTER — Other Ambulatory Visit: Payer: Self-pay | Admitting: *Deleted

## 2023-07-25 ENCOUNTER — Other Ambulatory Visit: Payer: Self-pay | Admitting: Family Medicine

## 2023-07-25 DIAGNOSIS — J3089 Other allergic rhinitis: Secondary | ICD-10-CM

## 2023-07-25 MED ORDER — CETIRIZINE HCL 10 MG PO TABS: 10.0000 mg | ORAL_TABLET | Freq: Every day | ORAL | 0 refills | Status: AC

## 2023-07-25 NOTE — Telephone Encounter (Addendum)
 Patient request medication to be sent to [facility] [place].  Insurance would not cover a 90 supply with the requested pharmacy but to [facility].   Quantity of 30 was sent to requested pharmacy.

## 2023-07-25 NOTE — Telephone Encounter (Signed)
 Copied from CRM [id number]. Topic: Clinical - Prescription Issue >> Jul 25, 2023  9:17 AM Elle L wrote: Reason for CRM: The patient states his cetirizine  (ZYRTEC ) 10 MG tablet was sent to the wrong pharmacy and would like it to be sent to [id number] - [place], [place] - [address] instead. I removed the other pharmacy from his chart at his request.

## 2023-07-26 NOTE — Addendum Note (Signed)
 Addended by: Merryl Abraham on: 07/26/2023 02:30 PM   Modules accepted: Orders

## 2023-07-26 NOTE — Telephone Encounter (Signed)
 Requested medications are due for refill today.  no  Requested medications are on the active medications list.  yes  Last refill. 07/25/2023 #30 0 rf  Future visit scheduled.   no  Notes to clinic.  Expired labs.    Requested Prescriptions  Pending Prescriptions Disp Refills   cetirizine  (ZYRTEC ) 10 MG tablet 30 tablet 0    Sig: Take 1 tablet (10 mg total) by mouth daily.     Ear, Nose, and Throat:  Antihistamines 2 Failed - 07/26/2023  2:31 PM      Failed - Cr in normal range and within 360 days    Creatinine, Ser  Date Value Ref Range Status  09/01/2020 1.07 0.61 - 1.24 mg/dL Final         Passed - Valid encounter within last 12 months    Recent Outpatient Visits           3 months ago Blepharitis of eyelid of right eye, unspecified eyelid, unspecified type   [facility], MD

## 2023-08-29 ENCOUNTER — Ambulatory Visit (INDEPENDENT_AMBULATORY_CARE_PROVIDER_SITE_OTHER)

## 2023-08-29 VITALS — BP 110/79 | HR 86 | Temp 97.9°F | Resp 16 | Ht 69.0 in | Wt 183.4 lb

## 2023-08-29 DIAGNOSIS — Z111 Encounter for screening for respiratory tuberculosis: Secondary | ICD-10-CM

## 2023-08-29 DIAGNOSIS — Z021 Encounter for pre-employment examination: Secondary | ICD-10-CM | POA: Diagnosis not present

## 2023-08-29 NOTE — Progress Notes (Signed)
 Patient ID: Darrell Sanchez, male    DOB: [birth year]  MRN: [id number]  CC: Annual Exam (-Patient is here to have annually  complete physical examination /-Care gap address/-labs taken ///Patient to get health form  fill out for employment )   Subjective: Darrell Sanchez is a 33 y.o. male who presents to clinic to obtain medical clearance form for work. No acute concerns at this time. Not currently on any medications.    No Known Allergies  Social History   Socioeconomic History   Marital status: Divorced    Spouse name: Not on file   Number of children: Not on file   Years of education: Not on file   Highest education level: Not on file  Occupational History   Not on file  Tobacco Use   Smoking status: Never   Smokeless tobacco: Never  Vaping Use   Vaping status: Never Used  Substance and Sexual Activity   Alcohol use: Yes    Comment: Occassionally.   Drug use: Yes    Types: Marijuana    Comment: Dailyt.   Sexual activity: Not Currently  Other Topics Concern   Not on file  Social History Narrative   Not on file   Social Drivers of Health   Financial Resource Strain: Low Risk  (04/05/2023)   Overall Financial Resource Strain (CARDIA)    Difficulty of Paying Living Expenses: Not very hard  Food Insecurity: No Food Insecurity (04/05/2023)   Hunger Vital Sign    Worried About Running Out of Food in the Last Year: Never true    Ran Out of Food in the Last Year: Never true  Transportation Needs: No Transportation Needs (04/05/2023)   PRAPARE - Administrator, Civil Service (Medical): No    Lack of Transportation (Non-Medical): No  Physical Activity: Inactive (04/05/2023)   Exercise Vital Sign    Days of Exercise per Week: 0 days    Minutes of Exercise per Session: 0 min  Stress: No Stress Concern Present (04/05/2023)   [employer] of Occupational Health - Occupational Stress Questionnaire    Feeling of Stress : Not at all  Social Connections: Moderately  Integrated (04/05/2023)   Social Connection and Isolation Panel    Frequency of Communication with Friends and Family: Twice a week    Frequency of Social Gatherings with Friends and Family: Twice a week    Attends Religious Services: Never    Database administrator or Organizations: No    Attends Engineer, structural: 1 to 4 times per year    Marital Status: Living with partner  Intimate Partner Violence: Not At Risk (04/05/2023)   Humiliation, Afraid, Rape, and Kick questionnaire    Fear of Current or Ex-Partner: No    Emotionally Abused: No    Physically Abused: No    Sexually Abused: No    No family history on file.  No past surgical history on file.  ROS: Review of Systems  Constitutional:  Negative for activity change and fatigue.  Respiratory:  Negative for chest tightness and shortness of breath.   Cardiovascular:  Negative for chest pain and palpitations.  Neurological:  Negative for weakness and light-headedness.   Negative except as stated above  PHYSICAL EXAM: BP 110/79   Pulse 86   Temp 97.9 F (36.6 C) (Oral)   Resp 16   Ht 5' 9 (1.753 m)   Wt 183 lb 6.4 oz (83.2 kg)   SpO2 96%  BMI 27.08 kg/m   Physical Exam Constitutional:      Appearance: Normal appearance.  HENT:     Right Ear: Tympanic membrane, ear canal and external ear normal.     Left Ear: Tympanic membrane, ear canal and external ear normal.     Nose: Nose normal.     Mouth/Throat:     Mouth: Mucous membranes are moist.  Eyes:     Pupils: Pupils are equal, round, and reactive to light.  Cardiovascular:     Rate and Rhythm: Normal rate and regular rhythm.     Pulses: Normal pulses.     Heart sounds: Normal heart sounds.  Pulmonary:     Effort: Pulmonary effort is normal.     Breath sounds: Normal breath sounds.  Musculoskeletal:        General: Normal range of motion.     Comments: Strength testing 5/5 in all extremities, equal bilaterally  Neurological:     General: No  focal deficit present.     Mental Status: He is alert and oriented to person, place, and time.  Psychiatric:        Mood and Affect: Mood normal.        Behavior: Behavior normal.        ASSESSMENT AND PLAN:  1. Physical exam, pre-employment (Primary) - No acute concerns - All immunizations up to date per [facility]  2. PPD screening test  - PPD test in clinic today, to be read on 07/19    Patient was given the opportunity to ask questions.  Patient verbalized understanding of the plan and was able to repeat key elements of the plan.    Orders Placed This Encounter  Procedures   PPD     No follow-ups on file.  Sula Leavy Rode, PA-C

## 2023-08-31 ENCOUNTER — Ambulatory Visit

## 2023-08-31 DIAGNOSIS — Z111 Encounter for screening for respiratory tuberculosis: Secondary | ICD-10-CM

## 2023-08-31 LAB — TB SKIN TEST
Induration: 0 mm
TB Skin Test: NEGATIVE

## 2023-08-31 NOTE — Progress Notes (Signed)
 Patient arrived for PPD reading. PPD reading was negative.
